# Patient Record
Sex: Female | Born: 1975 | Race: Asian | Hispanic: No | Marital: Married | Smoking: Never smoker
Health system: Southern US, Community
[De-identification: ages and names within clinical notes are randomized; demographics above are authoritative.]

## PROBLEM LIST (undated history)

## (undated) DIAGNOSIS — K2970 Gastritis, unspecified, without bleeding: Secondary | ICD-10-CM

## (undated) HISTORY — DX: Gastritis, unspecified, without bleeding: K29.70

---

## 2002-07-12 ENCOUNTER — Other Ambulatory Visit: Admission: RE | Admit: 2002-07-12 | Discharge: 2002-07-12 | Payer: Self-pay | Admitting: Obstetrics and Gynecology

## 2002-10-16 ENCOUNTER — Inpatient Hospital Stay (HOSPITAL_COMMUNITY): Admission: AD | Admit: 2002-10-16 | Discharge: 2002-10-17 | Payer: Self-pay | Admitting: Obstetrics and Gynecology

## 2006-11-22 ENCOUNTER — Inpatient Hospital Stay (HOSPITAL_COMMUNITY): Admission: AD | Admit: 2006-11-22 | Discharge: 2006-11-22 | Payer: Self-pay | Admitting: Obstetrics and Gynecology

## 2006-11-23 ENCOUNTER — Inpatient Hospital Stay (HOSPITAL_COMMUNITY): Admission: AD | Admit: 2006-11-23 | Discharge: 2006-11-23 | Payer: Self-pay | Admitting: Obstetrics and Gynecology

## 2007-02-22 ENCOUNTER — Inpatient Hospital Stay (HOSPITAL_COMMUNITY): Admission: AD | Admit: 2007-02-22 | Discharge: 2007-02-25 | Payer: Self-pay | Admitting: Obstetrics and Gynecology

## 2011-01-26 NOTE — H&P (Signed)
NAMELAAIBAH, WARTMAN                     ACCOUNT NO.:  [id number]   MEDICAL RECORD NO.:  [id number]          PATIENT TYPE:  INP   LOCATION:  9169                          FACILITY:  [facility]   PHYSICIAN:  Crist Fat. Rivard, M.D. DATE OF BIRTH:  1975/12/15   DATE OF ADMISSION:  02/22/2007  DATE OF DISCHARGE:                              HISTORY & PHYSICAL   Stacy Lyons is a 35 year old, married, Asian female, gravida 4, para 2-0-1-2  who presents at 40-1/7 weeks' gestation after having a routine office  visit and then going home.  During her office visit, it was found that  her cervix was dilated to an advanced state, and that combined with the  patient's history of rapid labor lead the offer of induction of labor to  occur.  She denies any bleeding, reports mild contractions, reports  positive fetal movement and no leaking.  Her pregnancy has been followed  by the [facility] OB/GYN MD service has been remarkable for:  1. Language barrier.  2. Late to care.  3. First trimester spotting.  4. Hepatitis B carrier.  5. History of ulcer.  6. First trimester tetracycline use.  7. Group B strep negative.   Her prenatal labs were collected on October 10, 2006:  Hemoglobin 11.5,  hematocrit 34, platelets 224,000.  Blood type O+, antibody negative, RPR  nonreactive, rubella immune, hepatitis B surface antigen negative, HIV  nonreactive.  Hemoglobin electrophoresis negative.  A 1-hour Glucola  from November 29, 2006 was 93.  Culture of the vaginal tract for group B  strep, gonorrhea and Chlamydia on Jan 19, 2007 were all negative.  Pap  was within normal limits, and gonorrhea and chlamydia were both  negative, and quad screen was normal.   HISTORY OF PRESENT PREGNANCY:  The patient presented for care at [facility] on October 10, 2006 at 20-5/7 weeks' gestation.  This was her  first prenatal visit.  Ultrasound on this day gives an [facility] of March 24, 2007.  All anatomy was seen.  Cervix was 1.7 cm  with dilation of 1.31 cm  with funneling on ultrasound.  To digital exam, cervix was fingertip and  50%.  Ultrasonography at 23-6/7 weeks' gestation showed her cervix to be  2.12 cm.  She was placed on bedrest, and ibuprofen routinely was  started.  Cervical length following week showed it to be 2.25 cm.  At 27  weeks' gestation, cervix was 1.83 cm with funneling.  The patient  received betamethasone series.  Ultrasound at 28 weeks showed cervix at  1.69 cm with digital exam fingertip and 50%.  The patient was continued  to be on bedrest and pelvic rest.  At 35 weeks, the patient's cervix was  3 cm, 80% vertex -1.  The rest of her prenatal care has been  unremarkable.   OBSTETRICAL HISTORY:  She is gravida 4, para 2-0-1-2.  In June of 2000,  she had a vaginal delivery of a female infant weighing 6 pounds 14  ounces at 40 weeks' gestation after 5-6 hours in labor.  She had no  anesthesia.  Infant's name was Stacy Lyons.  There were no complications,  and this birth took place in [place].  In August 2004, she had a  vaginal delivery of a female infant weighing 6 pounds 12 ounces at 40  weeks' gestation after 4 hours in labor with no anesthesia.  Infant's  name was Stacy Lyons, and there were no complications, and the patient was with  [facility] with that pregnancy and birth.   MEDICAL HISTORY:  She has no medication allergies.  She experienced  menarche at the age of 43 with 38-40 day cycles lasting 3-4 days.  The  patient is a hepatitis B carrier.  She has a history of an ulcer . Her  surgical history is negative.   FAMILY MEDICAL HISTORY:  The patient's daughter has asthma.  The  patient's father has type 2 diabetes.  Genetic history is negative.   SOCIAL HISTORY:  Patient is married to the father of the baby.  His name  is Qivia.  He is involved and supportive.  They both are high school  educated.  The patient is a Investment banker, operational.  Father of the baby is in  the restaurant  business.  They deny any alcohol, tobacco or illicit drug  use in the pregnancy, although the patient did take tetracycline in  August 2007 as a 2-week course.   OBJECTIVE:  Vital signs are stable.  She is afebrile.  HEENT:  Is grossly within normal limits.  CHEST:  Is clear to auscultation.  HEART:  Regular rate and rhythm.  ABDOMEN:  Is gravid in contour with fundal height extending  approximately 40 cm above the pubic symphysis.  Fetal heart rate is  reactive and reassuring.  Contractions are every 5-6 minutes and mild.  Cervix is 4 cm, 90% effaced, 0 to +1 per R.N. exam.  EXTREMITIES:  Are normal.   ASSESSMENT:  1. Intrauterine pregnancy at 40-1/7 weeks' gestation.  2. Advanced dilatation.  3. History of rapid labor.  4. Group B strep negative.   PLAN:  Dr. Estanislado Pandy reviewed the options with the patient that are  available to them.  Their preference is to rest tonight and proceed with  induction of labor in the morning.  They declined Pitocin and artificial  rupture of membranes tonight.      Cam Hai, C.N.M.      Crist Fat Rivard, M.D.  Electronically Signed    KS/MEDQ  D:  02/23/2007  T:  02/23/2007  Job:  [id number]

## 2011-01-29 NOTE — H&P (Signed)
Stacy Lyons, Stacy Lyons                                 ACCOUNT NO.:  [id number]   MEDICAL RECORD NO.:  [id number]                   PATIENT TYPE:  INP   LOCATION:  9170                                 [facility]ILITY:  [facility]   PHYSICIAN:  Osborn Coho, M.D.                DATE OF BIRTH:  [birth year]   DATE OF ADMISSION:  10/16/2002  DATE OF DISCHARGE:                                HISTORY & PHYSICAL   HISTORY OF PRESENT ILLNESS:  The patient is a 35 year old married Asian  female gravida 3, para 1-0-1-1 at 23 and 5/7 weeks who presented from the  office in early active labor.  She reports a little bit of vaginal spotting  over the weekend.  She denies any gushes of fluid.  She reports positive  fetal movement.  She reports uterine contractions approximately every five  minutes.  She was seen earlier over the weekend and was approximately 3 cm  at that time.  Her pregnancy has been followed at [facility] by  the certified nurse midwife service and has been complicated by late to  care, questionable LMP, slight language barrier.  Her group B Strep is  negative.   OB/GYN HISTORY:  She is a gravida 3, para 1-0-1-1 who delivered a viable  female infant in June of 2000 who weighed 6 pounds 14 ounces at [redacted] weeks  gestation following an eight hour labor.  She delivered with IV pain  medications and had no complications, though apparently did require forceps  for delivery.  She had a miscarriage in April of 2001 with no complications.   ALLERGIES:  She has no known drug allergies.   PAST MEDICAL HISTORY:  She reports having had the usual childhood diseases.  There are no other medical problems.  She has only been hospitalized for  childbirth.   FAMILY HISTORY:  Essentially negative, though her father does have diabetes  requiring oral medications.   GENETIC HISTORY:  Negative.   SOCIAL HISTORY:  She is married to [employer] and they are [place].  They  operate a Musician.   PRENATAL LABORATORIES:  Her blood type is O+.  Her antibody screen is  negative.  Sickle cell trait is negative.  Syphilis is nonreactive.  Rubella  is positive.  Hepatitis B surface antigen is negative.  HIV is nonreactive.  GC and Chlamydia are both negative.  Pap is within normal limits.  Her one  hour Glucola was 105.  Her 36-week beta Strep was negative as are gonorrhea  and Chlamydia.   PHYSICAL EXAMINATION:  VITAL SIGNS:  Stable.  She is afebrile.  HEENT:  Grossly within normal limits.  HEART:  Regular rhythm and rate.  CHEST:  Clear.  BREASTS:  Soft and nontender.  ABDOMEN:  Gravid with uterine contractions every five minutes.  Fetal heart  rate is reactive and reassuring.  PELVIC:  5 cm, 90%, vertex, -1 with intact membranes per Rica Koyanagi, C.N.M. at the office.  EXTREMITIES:  Within normal limits.   ASSESSMENT:  1. Intrauterine pregnancy at term.  2. Early active labor.  3. Negative group B Strep.   PLAN:  Admit to labor and delivery.  To follow routine C.N.M. orders.  To  notify Osborn Coho, M.D. of patient's admission.     Concha Pyo. Duplantis, C.N.M.              Osborn Coho, M.D.    SJD/MEDQ  D:  10/16/2002  T:  10/16/2002  Job:  [id number]

## 2011-07-01 LAB — CBC
HCT: 34.7 — ABNORMAL LOW
Hemoglobin: 12
Hemoglobin: 9.4 — ABNORMAL LOW
MCHC: 35.2
MCV: 100.1 — ABNORMAL HIGH
MCV: 100.2 — ABNORMAL HIGH
RBC: 2.67 — ABNORMAL LOW
RDW: 12.7

## 2012-06-05 ENCOUNTER — Telehealth: Payer: Self-pay | Admitting: Obstetrics and Gynecology

## 2012-06-05 NOTE — Telephone Encounter (Signed)
Chart reviewed. H&P from 2008 with info regarding pt's Hepatitis status faxed to [facility]. [phone]

## 2012-06-05 NOTE — Telephone Encounter (Signed)
TC from [facility], Communicable Disease. States has female pt with + Hepatitis B.  Needs to confirm status of his wife. Delivered 2008.  Informed will have to have record pulled form storage to confirm HBsag status.

## 2016-12-07 ENCOUNTER — Ambulatory Visit (INDEPENDENT_AMBULATORY_CARE_PROVIDER_SITE_OTHER): Payer: BLUE CROSS/BLUE SHIELD | Admitting: Family Medicine

## 2016-12-07 VITALS — BP 97/54 | HR 64 | Temp 99.0°F | Resp 17 | Ht 64.5 in | Wt 112.0 lb

## 2016-12-07 DIAGNOSIS — Z0000 Encounter for general adult medical examination without abnormal findings: Secondary | ICD-10-CM

## 2016-12-07 DIAGNOSIS — Z113 Encounter for screening for infections with a predominantly sexual mode of transmission: Secondary | ICD-10-CM

## 2016-12-07 DIAGNOSIS — Z1389 Encounter for screening for other disorder: Secondary | ICD-10-CM

## 2016-12-07 DIAGNOSIS — Z124 Encounter for screening for malignant neoplasm of cervix: Secondary | ICD-10-CM | POA: Diagnosis not present

## 2016-12-07 DIAGNOSIS — Z136 Encounter for screening for cardiovascular disorders: Secondary | ICD-10-CM | POA: Diagnosis not present

## 2016-12-07 DIAGNOSIS — Z130 Encounter for screening for diseases of the blood and blood-forming organs and certain disorders involving the immune mechanism: Secondary | ICD-10-CM

## 2016-12-07 DIAGNOSIS — Z23 Encounter for immunization: Secondary | ICD-10-CM | POA: Diagnosis not present

## 2016-12-07 DIAGNOSIS — Z1383 Encounter for screening for respiratory disorder NEC: Secondary | ICD-10-CM

## 2016-12-07 DIAGNOSIS — Z1329 Encounter for screening for other suspected endocrine disorder: Secondary | ICD-10-CM

## 2016-12-07 LAB — POCT URINALYSIS DIP (MANUAL ENTRY)
BILIRUBIN UA: NEGATIVE
GLUCOSE UA: NEGATIVE
Ketones, POC UA: NEGATIVE
LEUKOCYTES UA: NEGATIVE
NITRITE UA: NEGATIVE
PH UA: 7 (ref 5.0–8.0)
Protein Ur, POC: NEGATIVE
Spec Grav, UA: 1.015 (ref 1.030–1.035)
Urobilinogen, UA: 0.2 (ref ?–2.0)

## 2016-12-07 LAB — POCT WET + KOH PREP
TRICH BY WET PREP: ABSENT
YEAST BY WET PREP: ABSENT
Yeast by KOH: ABSENT

## 2016-12-07 NOTE — Progress Notes (Deleted)
   Subjective:  By signing my name below, I, Stann Oresung-Kai Tsai, attest that this documentation has been prepared under the direction and in the presence of Norberto SorensonEva Shaw, MD. Electronically Signed: Stann Oresung-Kai Tsai, Scribe. 12/07/2016 , 12:03 PM .  Patient was seen in Room 1 .   Patient ID: Stacy BowlJing Shepperson, female    DOB: Jul 12, 1976, 41 y.o.   MRN: [id number] Chief Complaint  Patient presents with  . Annual Exam   HPI Stacy Lyons is a 41 y.o. female who presents to Primary Care at [facility] for annual exam.   She also complains of right upper back pain, ongoing for a long time.    No past medical history on file. Prior to Admission medications   Not on File   Not on File   Review of Systems     Objective:   Physical Exam  Constitutional: She is oriented to person, place, and time. She appears well-developed and well-nourished. No distress.  HENT:  Head: Normocephalic and atraumatic.  Eyes: EOM are normal. Pupils are equal, round, and reactive to light.  Neck: Neck supple.  Cardiovascular: Normal rate.   Pulmonary/Chest: Effort normal. No respiratory distress.  Musculoskeletal: Normal range of motion.  Neurological: She is alert and oriented to person, place, and time.  Skin: Skin is warm and dry.  Psychiatric: She has a normal mood and affect. Her behavior is normal.  Nursing note and vitals reviewed.   BP (!) 97/54   Pulse 64   Temp 99 F (37.2 C) (Oral)   Resp 17   Ht 5' 4.5" (1.638 m)   Wt 112 lb (50.8 kg)   LMP 11/17/2016 (Approximate)   SpO2 97%   BMI 18.93 kg/m     Assessment & Plan:

## 2016-12-07 NOTE — Patient Instructions (Addendum)
Try some tums, zantac, or pepcid for when you have the epigastric pain.  If this is occurring more frequently or does not respond to the medication, than please come back in for further evaluation.  For your upper back pain, try heat, stretching, and massage. This is a common place for women to get muscle spasms and hold their tension or stress.  Use a tennis ball or a foam roller to lay on the area that you have having pain after 15 minutes of heat to try to get the muscle to relax. If this doesn't work, please come back in for further evaluation.    IF you received an x-ray today, you will receive an invoice from [facility]. Please contact [facility] at [phone] with questions or concerns regarding your invoice.   IF you received labwork today, you will receive an invoice from [facility]. Please contact [facility] at [phone] with questions or concerns regarding your invoice.   Our billing staff will not be able to assist you with questions regarding bills from these companies.  You will be contacted with the lab results as soon as they are available. The fastest way to get your results is to activate your My Chart account. Instructions are located on the last page of this paperwork. If you have not heard from [place] regarding the results in 2 weeks, please contact this office.    Health Maintenance, Female Adopting a healthy lifestyle and getting preventive care can go a long way to promote health and wellness. Talk with your health care provider about what schedule of regular examinations is right for you. This is a good chance for you to check in with your provider about disease prevention and staying healthy. In between checkups, there are plenty of things you can do on your own. Experts have done a lot of research about which lifestyle changes and preventive measures are most likely to keep you healthy. Ask your health care provider for more information. Weight and  diet Eat a healthy diet  Be sure to include plenty of vegetables, fruits, low-fat dairy products, and lean protein.  Do not eat a lot of foods high in solid fats, added sugars, or salt.  Get regular exercise. This is one of the most important things you can do for your health.  Most adults should exercise for at least 150 minutes each week. The exercise should increase your heart rate and make you sweat (moderate-intensity exercise).  Most adults should also do strengthening exercises at least twice a week. This is in addition to the moderate-intensity exercise. Maintain a healthy weight  Body mass index (BMI) is a measurement that can be used to identify possible weight problems. It estimates body fat based on height and weight. Your health care provider can help determine your BMI and help you achieve or maintain a healthy weight.  For females 33 years of age and older:  A BMI below 18.5 is considered underweight.  A BMI of 18.5 to 24.9 is normal.  A BMI of 25 to 29.9 is considered overweight.  A BMI of 30 and above is considered obese. Watch levels of cholesterol and blood lipids  You should start having your blood tested for lipids and cholesterol at 39 years of age, then have this test every 5 years.  You may need to have your cholesterol levels checked more often if:  Your lipid or cholesterol levels are high.  You are older than 39 years of age.  You are  at high risk for heart disease. Cancer screening Lung Cancer  Lung cancer screening is recommended for adults 65-33 years old who are at high risk for lung cancer because of a history of smoking.  A yearly low-dose CT scan of the lungs is recommended for people who:  Currently smoke.  Have quit within the past 15 years.  Have at least a 30-pack-year history of smoking. A pack year is smoking an average of one pack of cigarettes a day for 1 year.  Yearly screening should continue until it has been 15 years since  you quit.  Yearly screening should stop if you develop a health problem that would prevent you from having lung cancer treatment. Breast Cancer  Practice breast self-awareness. This means understanding how your breasts normally appear and feel.  It also means doing regular breast self-exams. Let your health care provider know about any changes, no matter how small.  If you are in your 20s or 30s, you should have a clinical breast exam (CBE) by a health care provider every 1-3 years as part of a regular health exam.  If you are 66 or older, have a CBE every year. Also consider having a breast X-ray (mammogram) every year.  If you have a family history of breast cancer, talk to your health care provider about genetic screening.  If you are at high risk for breast cancer, talk to your health care provider about having an MRI and a mammogram every year.  Breast cancer gene (BRCA) assessment is recommended for women who have family members with BRCA-related cancers. BRCA-related cancers include:  Breast.  Ovarian.  Tubal.  Peritoneal cancers.  Results of the assessment will determine the need for genetic counseling and BRCA1 and BRCA2 testing. Cervical Cancer  Your health care provider may recommend that you be screened regularly for cancer of the pelvic organs (ovaries, uterus, and vagina). This screening involves a pelvic examination, including checking for microscopic changes to the surface of your cervix (Pap test). You may be encouraged to have this screening done every 3 years, beginning at age 52.  For women ages 80-65, health care providers may recommend pelvic exams and Pap testing every 3 years, or they may recommend the Pap and pelvic exam, combined with testing for human papilloma virus (HPV), every 5 years. Some types of HPV increase your risk of cervical cancer. Testing for HPV may also be done on women of any age with unclear Pap test results.  Other health care providers  may not recommend any screening for nonpregnant women who are considered low risk for pelvic cancer and who do not have symptoms. Ask your health care provider if a screening pelvic exam is right for you.  If you have had past treatment for cervical cancer or a condition that could lead to cancer, you need Pap tests and screening for cancer for at least 20 years after your treatment. If Pap tests have been discontinued, your risk factors (such as having a new sexual partner) need to be reassessed to determine if screening should resume. Some women have medical problems that increase the chance of getting cervical cancer. In these cases, your health care provider may recommend more frequent screening and Pap tests. Colorectal Cancer  This type of cancer can be detected and often prevented.  Routine colorectal cancer screening usually begins at 39 years of age and continues through 39 years of age.  Your health care provider may recommend screening at an earlier age if  you have risk factors for colon cancer.  Your health care provider may also recommend using home test kits to check for hidden blood in the stool.  A small camera at the end of a tube can be used to examine your colon directly (sigmoidoscopy or colonoscopy). This is done to check for the earliest forms of colorectal cancer.  Routine screening usually begins at age 71.  Direct examination of the colon should be repeated every 5-10 years through 40 years of age. However, you may need to be screened more often if early forms of precancerous polyps or small growths are found. Skin Cancer  Check your skin from head to toe regularly.  Tell your health care provider about any new moles or changes in moles, especially if there is a change in a mole's shape or color.  Also tell your health care provider if you have a mole that is larger than the size of a pencil eraser.  Always use sunscreen. Apply sunscreen liberally and repeatedly  throughout the day.  Protect yourself by wearing long sleeves, pants, a wide-brimmed hat, and sunglasses whenever you are outside. Heart disease, diabetes, and high blood pressure  High blood pressure causes heart disease and increases the risk of stroke. High blood pressure is more likely to develop in:  People who have blood pressure in the high end of the normal range (130-139/85-89 mm Hg).  People who are overweight or obese.  People who are African American.  If you are 46-62 years of age, have your blood pressure checked every 3-5 years. If you are 52 years of age or older, have your blood pressure checked every year. You should have your blood pressure measured twice-once when you are at a hospital or clinic, and once when you are not at a hospital or clinic. Record the average of the two measurements. To check your blood pressure when you are not at a hospital or clinic, you can use:  An automated blood pressure machine at a pharmacy.  A home blood pressure monitor.  If you are between 63 years and 45 years old, ask your health care provider if you should take aspirin to prevent strokes.  Have regular diabetes screenings. This involves taking a blood sample to check your fasting blood sugar level.  If you are at a normal weight and have a low risk for diabetes, have this test once every three years after 39 years of age.  If you are overweight and have a high risk for diabetes, consider being tested at a younger age or more often. Preventing infection Hepatitis B  If you have a higher risk for hepatitis B, you should be screened for this virus. You are considered at high risk for hepatitis B if:  You were born in a country where hepatitis B is common. Ask your health care provider which countries are considered high risk.  Your parents were born in a high-risk country, and you have not been immunized against hepatitis B (hepatitis B vaccine).  You have HIV or AIDS.  You  use needles to inject street drugs.  You live with someone who has hepatitis B.  You have had sex with someone who has hepatitis B.  You get hemodialysis treatment.  You take certain medicines for conditions, including cancer, organ transplantation, and autoimmune conditions. Hepatitis C  Blood testing is recommended for:  Everyone born from [birth year] through 1963.  Anyone with known risk factors for hepatitis C. Sexually transmitted infections (STIs)  You should be screened for sexually transmitted infections (STIs) including gonorrhea and chlamydia if:  You are sexually active and are younger than 39 years of age.  You are older than 39 years of age and your health care provider tells you that you are at risk for this type of infection.  Your sexual activity has changed since you were last screened and you are at an increased risk for chlamydia or gonorrhea. Ask your health care provider if you are at risk.  If you do not have HIV, but are at risk, it may be recommended that you take a prescription medicine daily to prevent HIV infection. This is called pre-exposure prophylaxis (PrEP). You are considered at risk if:  You are sexually active and do not regularly use condoms or know the HIV status of your partner(s).  You take drugs by injection.  You are sexually active with a partner who has HIV. Talk with your health care provider about whether you are at high risk of being infected with HIV. If you choose to begin PrEP, you should first be tested for HIV. You should then be tested every 3 months for as long as you are taking PrEP. Pregnancy  If you are premenopausal and you may become pregnant, ask your health care provider about preconception counseling.  If you may become pregnant, take 400 to 800 micrograms (mcg) of folic acid every day.  If you want to prevent pregnancy, talk to your health care provider about birth control (contraception). Osteoporosis and  menopause  Osteoporosis is a disease in which the bones lose minerals and strength with aging. This can result in serious bone fractures. Your risk for osteoporosis can be identified using a bone density scan.  If you are 23 years of age or older, or if you are at risk for osteoporosis and fractures, ask your health care provider if you should be screened.  Ask your health care provider whether you should take a calcium or vitamin D supplement to lower your risk for osteoporosis.  Menopause may have certain physical symptoms and risks.  Hormone replacement therapy may reduce some of these symptoms and risks. Talk to your health care provider about whether hormone replacement therapy is right for you. Follow these instructions at home:  Schedule regular health, dental, and eye exams.  Stay current with your immunizations.  Do not use any tobacco products including cigarettes, chewing tobacco, or electronic cigarettes.  If you are pregnant, do not drink alcohol.  If you are breastfeeding, limit how much and how often you drink alcohol.  Limit alcohol intake to no more than 1 drink per day for nonpregnant women. One drink equals 12 ounces of beer, 5 ounces of wine, or 1 ounces of hard liquor.  Do not use street drugs.  Do not share needles.  Ask your health care provider for help if you need support or information about quitting drugs.  Tell your health care provider if you often feel depressed.  Tell your health care provider if you have ever been abused or do not feel safe at home. This information is not intended to replace advice given to you by your health care provider. Make sure you discuss any questions you have with your health care provider. Document Released: 03/15/2011 Document Revised: 02/05/2016 Document Reviewed: 06/03/2015 Elsevier Interactive Patient Education  2017 [employer].

## 2016-12-07 NOTE — Progress Notes (Signed)
Subjective:    Patient ID: Stacy Lyons, female    DOB: 1976-04-07, 41 y.o.   MRN: [id number] Chief Complaint  Patient presents with  . Annual Exam    HPI  Pt is here for her physical.  She states she has not been to the doctor in over 5 years.  Her youngest child is 519 yo and it might even have been about then that she last had her pelvic exam.  No h/o abnml pelvics.  Is married in a monogamous relationship. Use condoms for birth control. She does not desire any additional children but desires any form of birth control (ocps,im, implant, iud, ring, patch, etc).  She is fasting today.  Does not get any formal exercise as works all the time. No specific diet. No vit or otc meds/supps.  No family hx.  Depression screen PHQ 2/9 12/07/2016  Decreased Interest 0  Down, Depressed, Hopeless 0  PHQ - 2 Score 0    No past medical history on file. No past surgical history on file. No current outpatient prescriptions on file prior to visit.   No current facility-administered medications on file prior to visit.    Not on File No family history on file. Social History   Social History  . Marital status: Married    Spouse name: N/A  . Number of children: N/A  . Years of education: N/A   Social History Main Topics  . Smoking status: Never Smoker  . Smokeless tobacco: Never Used  . Alcohol use No  . Drug use: No  . Sexual activity: No   Other Topics Concern  . None   Social History Narrative  . None     Review of Systems  Gastrointestinal: Positive for abdominal pain.  Musculoskeletal: Positive for back pain.  All other systems reviewed and are negative. c/o chronic right upper back pain c/o occ epigastric pain when she is hungry - advised to try tums.    Objective:   Physical Exam  Constitutional: She is oriented to person, place, and time. She appears well-developed and well-nourished. No distress.  HENT:  Head: Normocephalic and atraumatic.  Right Ear: Tympanic  membrane, external ear and ear canal normal.  Left Ear: Tympanic membrane, external ear and ear canal normal.  Nose: Mucosal edema present. No rhinorrhea.  Mouth/Throat: Uvula is midline, oropharynx is clear and moist and mucous membranes are normal. No posterior oropharyngeal erythema.  Eyes: Conjunctivae and EOM are normal. Pupils are equal, round, and reactive to light. Right eye exhibits no discharge. Left eye exhibits no discharge. No scleral icterus.  Neck: Normal range of motion. Neck supple. No thyromegaly present.  Cardiovascular: Normal rate, regular rhythm, normal heart sounds and intact distal pulses.   Pulmonary/Chest: Effort normal and breath sounds normal. No respiratory distress.  Abdominal: Soft. Bowel sounds are normal. There is no tenderness.  Genitourinary: Vagina normal and uterus normal. No breast swelling, tenderness, discharge or bleeding. Cervix exhibits no motion tenderness and no friability. Right adnexum displays no mass and no tenderness. Left adnexum displays no mass and no tenderness.  Musculoskeletal: She exhibits no edema.  Lymphadenopathy:    She has no cervical adenopathy.  Neurological: She is alert and oriented to person, place, and time. She has normal reflexes.  Skin: Skin is warm and dry. She is not diaphoretic. No erythema.  Psychiatric: She has a normal mood and affect. Her behavior is normal.      BP (!) 97/54   Pulse 64  Temp 99 F (37.2 C) (Oral)   Resp 17   Ht 5' 4.5" (1.638 m)   Wt 112 lb (50.8 kg)   LMP 11/17/2016 (Approximate)   SpO2 97%   BMI 18.93 kg/m      Assessment & Plan:   1. Annual physical exam   2. Routine screening for STI (sexually transmitted infection)   3. Screening for cardiovascular, respiratory, and genitourinary diseases   4. Screening for cervical cancer   5. Screening for deficiency anemia   6. Screening for thyroid disorder     Orders Placed This Encounter  Procedures  . Tdap vaccine greater than or  equal to 7yo IM  . Comprehensive metabolic panel    Order Specific Question:   Has the patient fasted?    Answer:   Yes  . CBC  . TSH  . Lipid panel    Order Specific Question:   Has the patient fasted?    Answer:   Yes  . HCV Ab w/Rflx to Verification  . HIV antibody  . RPR  . Care order/instruction:    Scheduling Instructions:     Complete orders, AVS and go.  Marland Kitchen POCT urinalysis dipstick  . POCT Wet + KOH Prep    Norberto Sorenson, M.D.  Primary Care at [facility] [address] [place], [place] [address] [phone] phone [phone] fax  12/08/16 4:14 AM

## 2016-12-08 LAB — COMPREHENSIVE METABOLIC PANEL
ALK PHOS: 61 IU/L (ref 39–117)
ALT: 24 IU/L (ref 0–32)
AST: 22 IU/L (ref 0–40)
Albumin/Globulin Ratio: 1.7 (ref 1.2–2.2)
Albumin: 4.7 g/dL (ref 3.5–5.5)
BUN / CREAT RATIO: 21 (ref 9–23)
BUN: 13 mg/dL (ref 6–20)
Bilirubin Total: 0.7 mg/dL (ref 0.0–1.2)
CHLORIDE: 102 mmol/L (ref 96–106)
CO2: 24 mmol/L (ref 18–29)
CREATININE: 0.62 mg/dL (ref 0.57–1.00)
Calcium: 9.3 mg/dL (ref 8.7–10.2)
GFR calc Af Amer: 132 mL/min/{1.73_m2} (ref >59)
GFR calc non Af Amer: 115 mL/min/{1.73_m2} (ref >59)
GLUCOSE: 84 mg/dL (ref 65–99)
Globulin, Total: 2.7 g/dL (ref 1.5–4.5)
Potassium: 4 mmol/L (ref 3.5–5.2)
Sodium: 143 mmol/L (ref 134–144)
Total Protein: 7.4 g/dL (ref 6.0–8.5)

## 2016-12-08 LAB — LIPID PANEL
CHOL/HDL RATIO: 2.7 ratio (ref 0.0–4.4)
CHOLESTEROL TOTAL: 172 mg/dL (ref 100–199)
HDL: 64 mg/dL (ref >39)
LDL CALC: 96 mg/dL (ref 0–99)
TRIGLYCERIDES: 61 mg/dL (ref 0–149)
VLDL Cholesterol Cal: 12 mg/dL (ref 5–40)

## 2016-12-08 LAB — CBC
HEMATOCRIT: 38 % (ref 34.0–46.6)
Hemoglobin: 12.6 g/dL (ref 11.1–15.9)
MCH: 32 pg (ref 26.6–33.0)
MCHC: 33.2 g/dL (ref 31.5–35.7)
MCV: 96 fL (ref 79–97)
PLATELETS: 242 10*3/uL (ref 150–379)
RBC: 3.94 x10E6/uL (ref 3.77–5.28)
RDW: 12.2 % — AB (ref 12.3–15.4)
WBC: 4.4 10*3/uL (ref 3.4–10.8)

## 2016-12-08 LAB — TSH: TSH: 2.19 u[IU]/mL (ref 0.450–4.500)

## 2016-12-09 LAB — PAP IG, CT-NG NAA, HPV HIGH-RISK
Chlamydia, Nuc. Acid Amp: NEGATIVE
GONOCOCCUS BY NUCLEIC ACID AMP: NEGATIVE
HPV, HIGH-RISK: NEGATIVE
PAP Smear Comment: 0

## 2016-12-10 LAB — HIV ANTIBODY (ROUTINE TESTING W REFLEX)

## 2016-12-10 LAB — RPR: RPR: NONREACTIVE

## 2016-12-10 LAB — HCV AB W/RFLX TO VERIFICATION: HCV Ab: <0.1 s/co ratio (ref 0.0–0.9)

## 2016-12-10 LAB — HCV INTERPRETATION

## 2017-10-14 ENCOUNTER — Encounter: Payer: Self-pay | Admitting: Family Medicine

## 2017-10-14 ENCOUNTER — Ambulatory Visit (INDEPENDENT_AMBULATORY_CARE_PROVIDER_SITE_OTHER): Payer: BLUE CROSS/BLUE SHIELD

## 2017-10-14 ENCOUNTER — Other Ambulatory Visit: Payer: Self-pay

## 2017-10-14 ENCOUNTER — Ambulatory Visit: Payer: BLUE CROSS/BLUE SHIELD | Admitting: Family Medicine

## 2017-10-14 VITALS — BP 96/60 | HR 73 | Temp 98.6°F | Resp 16 | Ht 63.5 in | Wt 111.4 lb

## 2017-10-14 DIAGNOSIS — D7589 Other specified diseases of blood and blood-forming organs: Secondary | ICD-10-CM

## 2017-10-14 DIAGNOSIS — R079 Chest pain, unspecified: Secondary | ICD-10-CM | POA: Diagnosis not present

## 2017-10-14 DIAGNOSIS — N644 Mastodynia: Secondary | ICD-10-CM

## 2017-10-14 DIAGNOSIS — R222 Localized swelling, mass and lump, trunk: Secondary | ICD-10-CM

## 2017-10-14 MED ORDER — OMEPRAZOLE 40 MG PO CPDR: 40.0000 mg | DELAYED_RELEASE_CAPSULE | Freq: Every day | ORAL | 3 refills | Status: AC

## 2017-10-14 NOTE — Patient Instructions (Addendum)
IF you received an x-ray today, you will receive an invoice from [facility]. Please contact [facility] at [phone] with questions or concerns regarding your invoice.   IF you received labwork today, you will receive an invoice from [facility]. Please contact [facility] at [phone] with questions or concerns regarding your invoice.   Our billing staff will not be able to assist you with questions regarding bills from these companies.  You will be contacted with the lab results as soon as they are available. The fastest way to get your results is to activate your My Chart account. Instructions are located on the last page of this paperwork. If you have not heard from [place] regarding the results in 2 weeks, please contact this office.     Chest Wall Pain Chest wall pain is pain in or around the bones and muscles of your chest. Sometimes, an injury causes this pain. Sometimes, the cause may not be known. This pain may take several weeks or longer to get better. Follow these instructions at home: Pay attention to any changes in your symptoms. Take these actions to help with your pain:  Rest as told by your health care provider.  Avoid activities that cause pain. These include any activities that use your chest muscles or your abdominal and side muscles to lift heavy items.  If directed, apply ice to the painful area: ? Put ice in a plastic bag. ? Place a towel between your skin and the bag. ? Leave the ice on for 20 minutes, 2-3 times per day.  Take over-the-counter and prescription medicines only as told by your health care provider.  Do not use tobacco products, including cigarettes, chewing tobacco, and e-cigarettes. If you need help quitting, ask your health care provider.  Keep all follow-up visits as told by your health care provider. This is important.  Contact a health care provider if:  You have a fever.  Your chest pain becomes worse.  You have  new symptoms. Get help right away if:  You have nausea or vomiting.  You feel sweaty or light-headed.  You have a cough with phlegm (sputum) or you cough up blood.  You develop shortness of breath. This information is not intended to replace advice given to you by your health care provider. Make sure you discuss any questions you have with your health care provider. Document Released: 08/30/2005 Document Revised: 01/08/2016 Document Reviewed: 11/25/2014 Elsevier Interactive Patient Education  2018 [employer]vier Inc.  Nonspecific Chest Pain Chest pain can be caused by many different conditions. There is always a chance that your pain could be related to something serious, such as a heart attack or a blood clot in your lungs. Chest pain can also be caused by conditions that are not life-threatening. If you have chest pain, it is very important to follow up with your health care provider. What are the causes? Causes of this condition include:  Heartburn.  Pneumonia or bronchitis.  Anxiety or stress.  Inflammation around your heart (pericarditis) or lung (pleuritis or pleurisy).  A blood clot in your lung.  A collapsed lung (pneumothorax). This can develop suddenly on its own (spontaneous pneumothorax) or from trauma to the chest.  Shingles infection (varicella-zoster virus).  Heart attack.  Damage to the bones, muscles, and cartilage that make up your chest wall. This can include: ? Bruised bones due to injury. ? Strained muscles or cartilage due to frequent or repeated coughing or overwork. ? Fracture to one  or more ribs. ? Sore cartilage due to inflammation (costochondritis).  What increases the risk? Risk factors for this condition may include:  Activities that increase your risk for trauma or injury to your chest.  Respiratory infections or conditions that cause frequent coughing.  Medical conditions or overeating that can cause heartburn.  Heart disease or family  history of heart disease.  Conditions or health behaviors that increase your risk of developing a blood clot.  Having had chicken pox (varicella zoster).  What are the signs or symptoms? Chest pain can feel like:  Burning or tingling on the surface of your chest or deep in your chest.  Crushing, pressure, aching, or squeezing pain.  Dull or sharp pain that is worse when you move, cough, or take a deep breath.  Pain that is also felt in your back, neck, shoulder, or arm, or pain that spreads to any of these areas.  Your chest pain may come and go, or it may stay constant. How is this diagnosed? Lab tests or other studies may be needed to find the cause of your pain. Your health care provider may have you take a test called an ECG (electrocardiogram). An ECG records your heartbeat patterns at the time the test is performed. You may also have other tests, such as:  Transthoracic echocardiogram (TTE). In this test, sound waves are used to create a picture of the heart structures and to look at how blood flows through your heart.  Transesophageal echocardiogram (TEE).This is a more advanced imaging test that takes images from inside your body. It allows your health care provider to see your heart in finer detail.  Cardiac monitoring. This allows your health care provider to monitor your heart rate and rhythm in real time.  Holter monitor. This is a portable device that records your heartbeat and can help to diagnose abnormal heartbeats. It allows your health care provider to track your heart activity for several days, if needed.  Stress tests. These can be done through exercise or by taking medicine that makes your heart beat more quickly.  Blood tests.  Other imaging tests.  How is this treated? Treatment depends on what is causing your chest pain. Treatment may include:  Medicines. These may include: ? Acid blockers for heartburn. ? Anti-inflammatory medicine. ? Pain medicine  for inflammatory conditions. ? Antibiotic medicine, if an infection is present. ? Medicines to dissolve blood clots. ? Medicines to treat coronary artery disease (CAD).  Supportive care for conditions that do not require medicines. This may include: ? Resting. ? Applying heat or cold packs to injured areas. ? Limiting activities until pain decreases.  Follow these instructions at home: Medicines  If you were prescribed an antibiotic, take it as told by your health care provider. Do not stop taking the antibiotic even if you start to feel better.  Take over-the-counter and prescription medicines only as told by your health care provider. Lifestyle  Do not use any products that contain nicotine or tobacco, such as cigarettes and e-cigarettes. If you need help quitting, ask your health care provider.  Do not drink alcohol.  Make lifestyle changes as directed by your health care provider. These may include: ? Getting regular exercise. Ask your health care provider to suggest some activities that are safe for you. ? Eating a heart-healthy diet. A registered dietitian can help you to learn healthy eating options. ? Maintaining a healthy weight. ? Managing diabetes, if necessary. ? Reducing stress, such as with yoga  or relaxation techniques. General instructions  Avoid any activities that bring on chest pain.  If heartburn is the cause for your chest pain, raise (elevate) the head of your bed about 6 inches (15 cm) by putting blocks under the legs. Sleeping with more pillows does not effectively relieve heartburn because it only changes the position of your head.  Keep all follow-up visits as told by your health care provider. This is important. This includes any further testing if your chest pain does not go away. Contact a health care provider if:  Your chest pain does not go away.  You have a rash with blisters on your chest.  You have a fever.  You have chills. Get help right  away if:  Your chest pain is worse.  You have a cough that gets worse, or you cough up blood.  You have severe pain in your abdomen.  You have severe weakness.  You faint.  You have sudden, unexplained chest discomfort.  You have sudden, unexplained discomfort in your arms, back, neck, or jaw.  You have shortness of breath at any time.  You suddenly start to sweat, or your skin gets clammy.  You feel nauseous or you vomit.  You suddenly feel light-headed or dizzy.  Your heart begins to beat quickly, or it feels like it is skipping beats. These symptoms may represent a serious problem that is an emergency. Do not wait to see if the symptoms will go away. Get medical help right away. Call your local emergency services (911 in the [place] Do not drive yourself to the hospital. This information is not intended to replace advice given to you by your health care provider. Make sure you discuss any questions you have with your health care provider. Document Released: 06/09/2005 Document Revised: 05/24/2016 Document Reviewed: 05/24/2016 Elsevier Interactive Patient Education  2017 [employer]

## 2017-10-14 NOTE — Progress Notes (Signed)
Subjective:    Patient ID: Stacy Lyons, female    DOB: Jan 25, 1976, 42 y.o.   MRN: [id number] Chief Complaint  Patient presents with  . chest pain    x 1 month, "feels like a needle pinch" left side rib around to the back    HPI  Stacy Lyons is a delightful 42 yo woman who is accompanied by her husband today with continuing c/o stinging left upper chest pain, a different chest heaviness, and a new breast/chest wall nodule. Her husband helps her translate and they remember discussing his initial chest stinging with me at pt's CPE 10 mos ago but everything on her CPE was normal and I advised they come in for a visit dedicated to eval of her sxs if they persist which is what she is here for today.  There was a significant amount of confusion in differentiating each of these 3 different concerns and getting independent time course and hx of each partially due to language barrier and also as pt states she hasn't paid enough attention to her sxs to answer many of my questions.  Chest pain has been there for about a month - feels like a need poking her.  I saw her 1 year ago.  For the past month, she has had intermittent daily stabbing pain.  She works in [employer].  She doesn't pay attention to what makes it worse or better.  No SHoB,  No change in exercise tolerance.  No heartburn or indigestion. She does also feel  Pinching in the upper abdomen. Long-standing stomach problems prior from not eating regularly. No n/v.  No cough, occ palpitations at night when she is trying to go to sleep.  Sometimes a cough wakes her up in the middle of the night. She will go for 2-3 days in between bowel movements.  Also with left-sided breast pain for over a year that radiates into her axilla and into her left upper back as well.   Working in [employer].   No vit/supp/otc meds.  No past medical history on file. History reviewed. No pertinent surgical history. No current outpatient medications on file prior to visit.    No current facility-administered medications on file prior to visit.    No Known Allergies No family history on file. Social History   Socioeconomic History  . Marital status: Married    Spouse name: None  . Number of children: None  . Years of education: None  . Highest education level: None  Social Needs  . Financial resource strain: None  . Food insecurity - worry: None  . Food insecurity - inability: None  . Transportation needs - medical: None  . Transportation needs - non-medical: None  Occupational History  . None  Tobacco Use  . Smoking status: Never Smoker  . Smokeless tobacco: Never Used  Substance and Sexual Activity  . Alcohol use: No  . Drug use: No  . Sexual activity: No  Other Topics Concern  . None  Social History Narrative  . None   [facility] 10/14/2017 12/07/2016  Decreased Interest 0 0  Down, Depressed, Hopeless 0 0  PHQ - 2 Score 0 0     Review of Systems  Constitutional: Negative for activity change, appetite change, chills, diaphoresis, fever and unexpected weight change.  Eyes: Negative for visual disturbance.  Respiratory: Positive for cough and chest tightness. Negative for shortness of breath and wheezing.   Cardiovascular: Positive for chest pain and palpitations. Negative  for leg swelling.  Gastrointestinal: Positive for abdominal pain and constipation. Negative for nausea and vomiting.  Genitourinary: Negative for decreased urine volume.  Musculoskeletal: Positive for back pain. Negative for arthralgias, gait problem, joint swelling and myalgias.  Skin: Negative for color change, rash and wound.  Neurological: Negative for syncope, weakness and headaches.  Hematological: Does not bruise/bleed easily.  Psychiatric/Behavioral: Negative for dysphoric mood.       Objective:   Physical Exam  Constitutional: She is oriented to person, place, and time. She appears well-developed and well-nourished. No distress.  HENT:   Head: Normocephalic and atraumatic.  Right Ear: External ear normal.  Left Ear: External ear normal.  Eyes: Conjunctivae are normal. No scleral icterus.  Neck: Normal range of motion. Neck supple. No thyromegaly present.  Cardiovascular: Normal rate, regular rhythm, normal heart sounds and intact distal pulses.  Pulmonary/Chest: Effort normal and breath sounds normal. No respiratory distress. She exhibits mass. She exhibits no tenderness, no bony tenderness, no crepitus, no edema, no deformity and no swelling. Right breast exhibits no inverted nipple, no nipple discharge, no skin change and no tenderness. Left breast exhibits no inverted nipple, no nipple discharge, no skin change and no tenderness. Breasts are symmetrical.    Tiny 1x70mm subdermal poorly defined mobile soft mass just distal and lateal to left breast on top of rib  Genitourinary: No breast swelling, tenderness, discharge or bleeding.  Musculoskeletal: She exhibits no edema.  Lymphadenopathy:    She has no cervical adenopathy.    She has no axillary adenopathy.       Right: No supraclavicular adenopathy present.       Left: No supraclavicular adenopathy present.  Neurological: She is alert and oriented to person, place, and time.  Skin: Skin is warm and dry. She is not diaphoretic. No erythema.  Psychiatric: She has a normal mood and affect. Her behavior is normal.         BP 96/60   Pulse 73   Temp 98.6 F (37 C) (Oral)   Resp 16   Ht 5' 3.5" (1.613 m)   Wt 111 lb 6.4 oz (50.5 kg)   LMP 09/23/2017   SpO2 100%   BMI 19.42 kg/m  Predicted peak flow 479 L/min - pt's peak flow 350 but CMA noted what seemed like poor effort - possibly due to language barrier  Dg Chest 2 View  Result Date: 10/14/2017 CLINICAL DATA:  atypical bilateral upper stabbing CP and left lower chest pain radiating to axilla and scapula EXAM: CHEST  2 VIEW COMPARISON:  None. FINDINGS: The heart size and mediastinal contours are within normal  limits. Both lungs are clear. No pleural effusion or pneumothorax. The visualized skeletal structures are unremarkable. IMPRESSION: No active cardiopulmonary disease. Electronically Signed   By: Amie Portland M.D.   On: 10/14/2017 10:33   EKG: NSR, no acute ischemic changes noted. No prior EKG available for comparison.   I have personally reviewed the EKG tracing and agree with the computer interpretation.  Assessment & Plan:   1. Left sided chest pain - several different types of very atypical long-standing pain. Normal CPE and full labs 10 mos prior and pt will RTC in 2 mos again to f/u CPE.   I wonder if this could poss be due to anxiety as when pt and I were reviewing the normal tests from her CPE and today at the end of the visit she expressed a great deal of relief.  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  Will try empiric 6-8 wk course of ppi and then recheck effects at CPE in 2 mos.  2. Breast pain, left   3. Nodule of left anterior chest wall - small subcutaneous mass over rib just distal and lateal to breast - this is certainly within the region that women can have breast tissue extend so I think we should proceed with diagnostic mam and [place] for further eval. Pt agreeable.    Orders Placed This Encounter  Procedures  . DG Chest 2 View    Standing Status:   Future    Number of Occurrences:   1    Standing Expiration Date:   10/14/2018    Order Specific Question:   Reason for Exam (SYMPTOM  OR DIAGNOSIS REQUIRED)    Answer:   atypical bilateral upper stabbing CP and left lower chest pain radiating to axilla and scapula    Order Specific Question:   Is the patient pregnant?    Answer:   No    Order Specific Question:   Preferred imaging location?    Answer:   External  . [facility]    Standing Status:   Future    Standing Expiration Date:   12/13/2018    Order Specific Question:   Reason for Exam (SYMPTOM  OR DIAGNOSIS REQUIRED)    Answer:   small  sev mm mobile nodule on distal and lateral to left breast, left breast and axilla pain    Order Specific Question:   Is the patient pregnant?    Answer:   No    Order Specific Question:   Preferred imaging location?    Answer:   [facility]  . US BREAST COMPLETE UNI LEFT INC AXILLA    Standing Status:   Future    Standing Expiration Date:   12/13/2018    Order Specific Question:   Reason for Exam (SYMPTOM  OR DIAGNOSIS REQUIRED)    Answer:   small sev mm mobile nodule on distal and lateral to left breast, left breast and axilla pain    Order Specific Question:   Preferred imaging location?    Answer:   [facility]  . H. pylori breath test  . CBC with Differential/Platelet  . Sedimentation Rate  . C-reactive protein  . Care order/instruction:    Scheduling Instructions:     Peak Flow (IF NEB IS ORDERED PLEASE DO BEFORE AND AFTER NEB)  . EKG 12-Lead    Meds ordered this encounter  Medications  . omeprazole (PRILOSEC) 40 MG capsule    Sig: Take 1 capsule (40 mg total) by mouth daily. 30 minutes before breakast    Dispense:  30 capsule    Refill:  3   Over 40 min spent in face-to-face evaluation of and consultation with patient and coordination of care.  Over 50% of this time was spent counseling this patient regarding potential etiologies of atypical chest pain and reassurance that sxs are not concerning for any harmful or progressive pathology.   Norberto Sorenson, M.D.  Primary Care at [facility] [address] [place], [place] [address] [phone] phone [phone] fax  10/14/17 11:25 AM

## 2017-10-15 LAB — CBC WITH DIFFERENTIAL/PLATELET
BASOS: 1 %
Basophils Absolute: 0 10*3/uL (ref 0.0–0.2)
EOS (ABSOLUTE): 0.1 10*3/uL (ref 0.0–0.4)
EOS: 2 %
HEMATOCRIT: 37.7 % (ref 34.0–46.6)
HEMOGLOBIN: 12.1 g/dL (ref 11.1–15.9)
IMMATURE GRANS (ABS): 0 10*3/uL (ref 0.0–0.1)
IMMATURE GRANULOCYTES: 0 %
LYMPHS: 35 %
Lymphocytes Absolute: 1.4 10*3/uL (ref 0.7–3.1)
MCH: 31.5 pg (ref 26.6–33.0)
MCHC: 32.1 g/dL (ref 31.5–35.7)
MCV: 98 fL — AB (ref 79–97)
Monocytes Absolute: 0.3 10*3/uL (ref 0.1–0.9)
Monocytes: 7 %
NEUTROS ABS: 2.3 10*3/uL (ref 1.4–7.0)
NEUTROS PCT: 55 %
Platelets: 246 10*3/uL (ref 150–379)
RBC: 3.84 x10E6/uL (ref 3.77–5.28)
RDW: 12.8 % (ref 12.3–15.4)
WBC: 4.1 10*3/uL (ref 3.4–10.8)

## 2017-10-15 LAB — C-REACTIVE PROTEIN: CRP: <0.3 mg/L (ref 0.0–4.9)

## 2017-10-15 LAB — SEDIMENTATION RATE: Sed Rate: 2 mm/hr (ref 0–32)

## 2017-10-16 ENCOUNTER — Encounter: Payer: Self-pay | Admitting: Family Medicine

## 2017-10-16 LAB — H. PYLORI BREATH TEST: H PYLORI BREATH TEST: NEGATIVE

## 2017-10-17 ENCOUNTER — Encounter: Payer: Self-pay | Admitting: *Deleted

## 2017-10-18 NOTE — Addendum Note (Signed)
Addended by: Baldwin CrownJOHNSON, Leeanne Butters D on: 10/18/2017 09:22 AM   Modules accepted: Orders

## 2017-10-19 LAB — B12 AND FOLATE PANEL
Folate: 17.3 ng/mL (ref >3.0)
VITAMIN B 12: 612 pg/mL (ref 232–1245)

## 2017-11-14 ENCOUNTER — Ambulatory Visit
Admission: RE | Admit: 2017-11-14 | Discharge: 2017-11-14 | Disposition: A | Discharge status: Home / Self Care | Payer: BLUE CROSS/BLUE SHIELD | Source: Ambulatory Visit | Attending: Family Medicine | Admitting: Family Medicine

## 2017-11-14 ENCOUNTER — Other Ambulatory Visit: Payer: Self-pay

## 2017-11-14 DIAGNOSIS — N644 Mastodynia: Secondary | ICD-10-CM

## 2017-11-14 DIAGNOSIS — R079 Chest pain, unspecified: Secondary | ICD-10-CM

## 2017-11-14 DIAGNOSIS — R222 Localized swelling, mass and lump, trunk: Secondary | ICD-10-CM

## 2019-07-02 IMAGING — MG DIGITAL DIAGNOSTIC BILATERAL MAMMOGRAM WITH TOMO AND CAD
8 series · 9 of 24 positions shown · non-contrast
Comparison: Previous exam(s).

CLINICAL DATA: The patient had a lump in the inferior left breast
which she no longer feels. She is still concerned.

EXAM:
DIGITAL DIAGNOSTIC BILATERAL MAMMOGRAM WITH CAD AND TOMO
ULTRASOUND LEFT BREAST

[L MLO synth-2D]
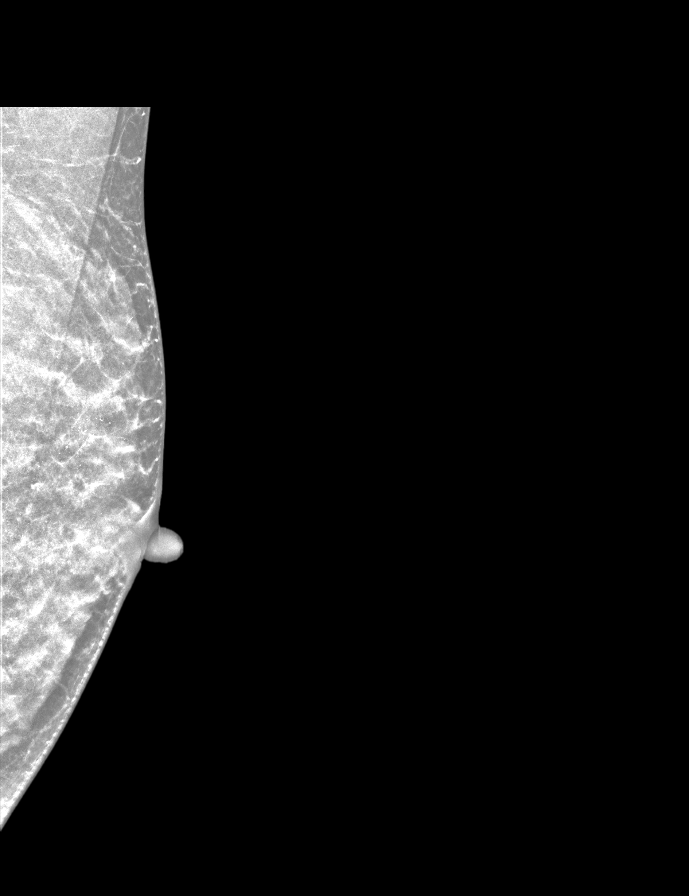

[R MLO synth-2D]
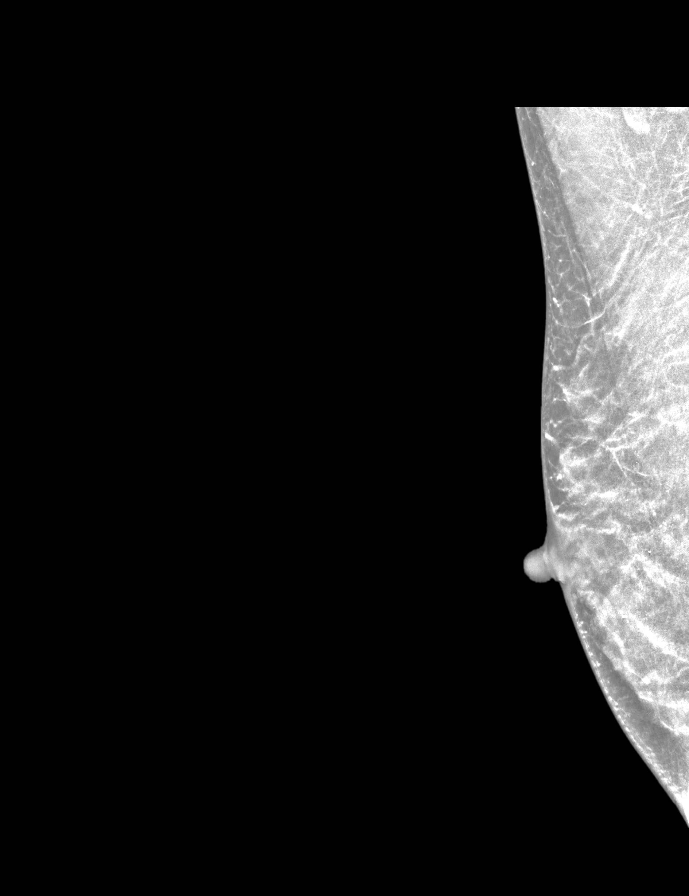

[L CC synth-2D]
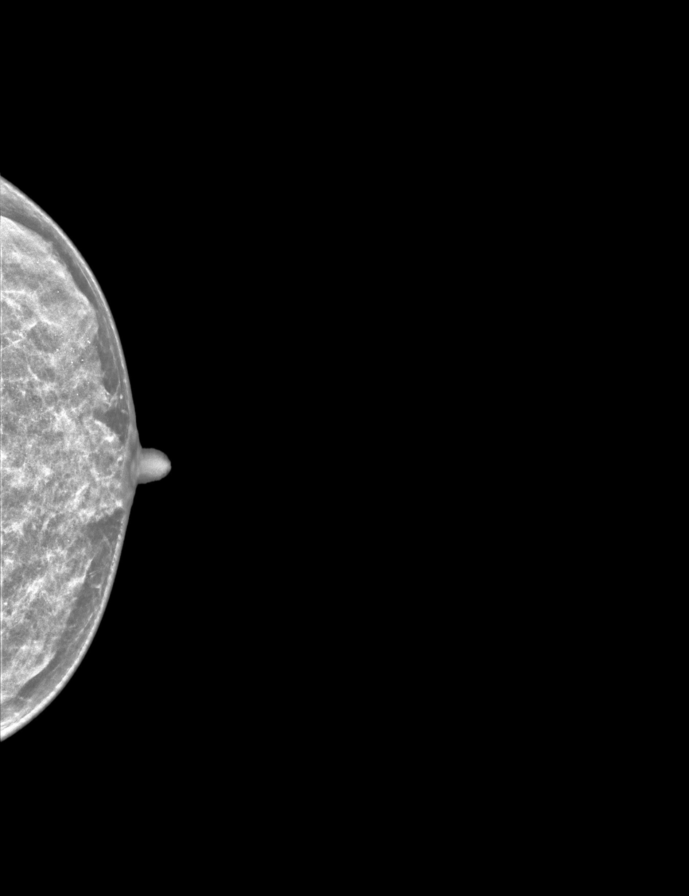

[R CC synth-2D]
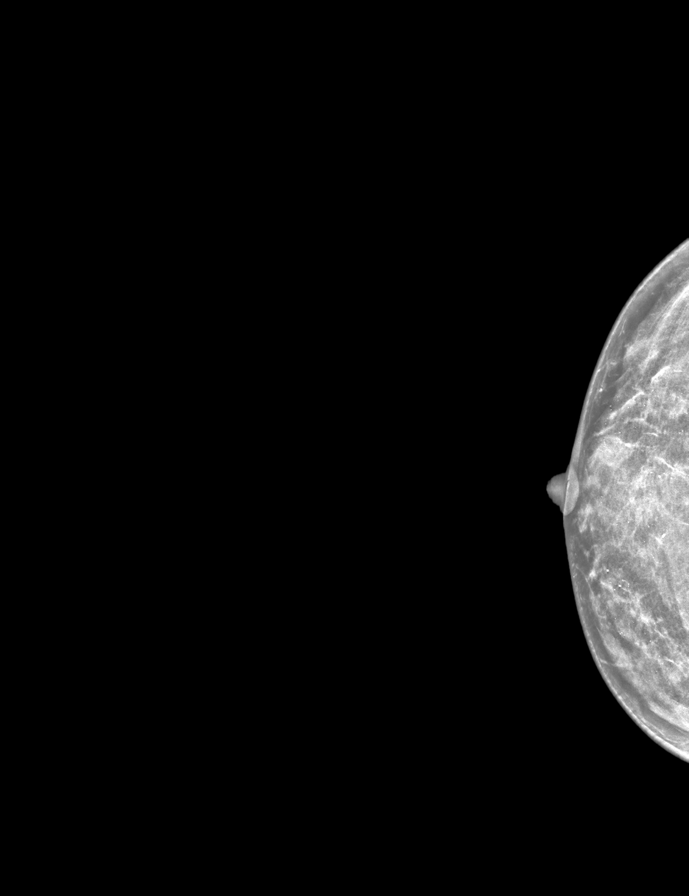

[R CC tomo · 2 of 42 frames shown]
[frame 14/42]
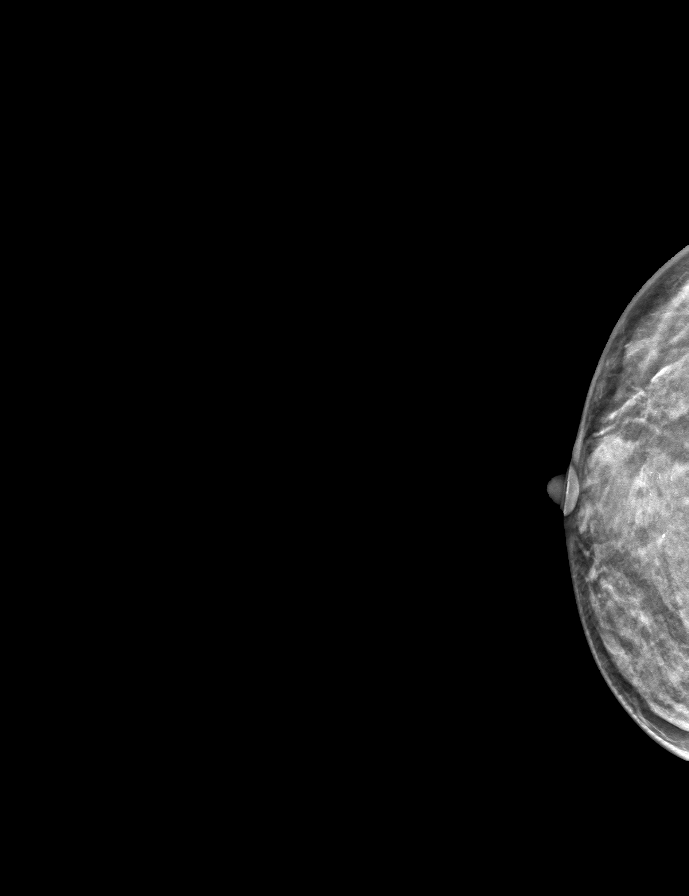
[frame 21/42]
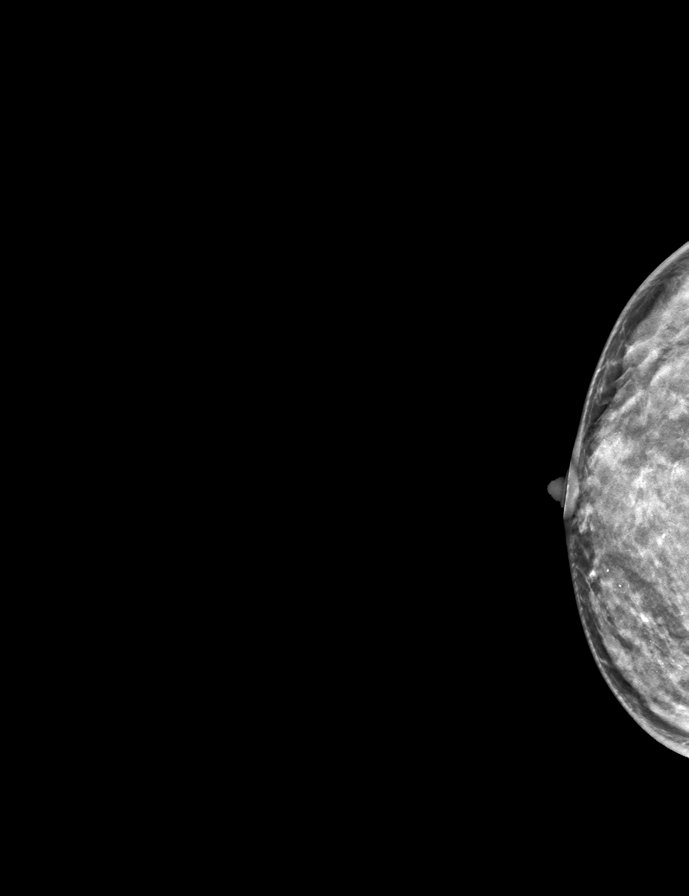

[R MLO tomo · tomo slice 21/41.0]
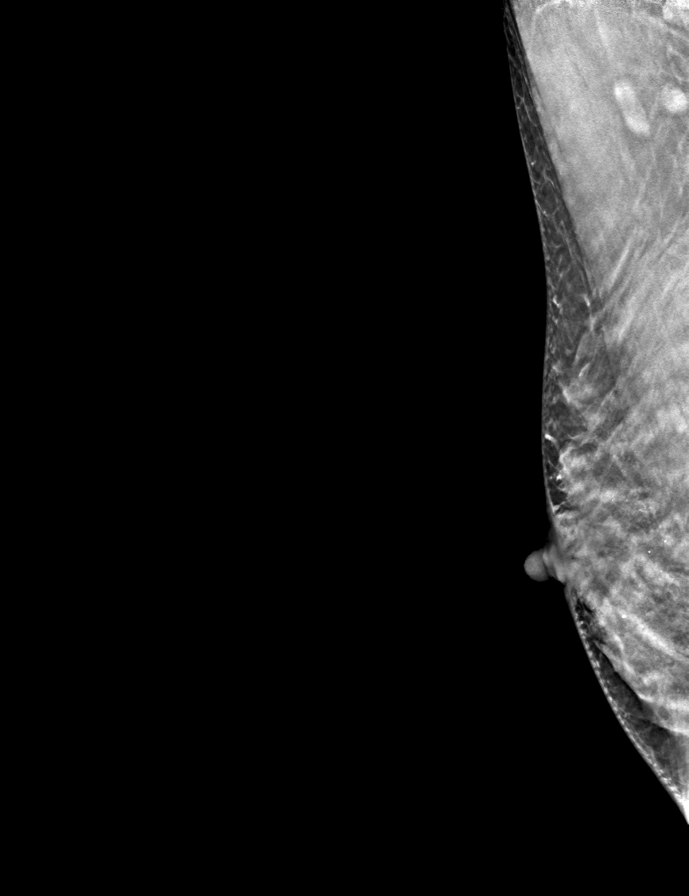

[L CC tomo · tomo slice 19/38.0]
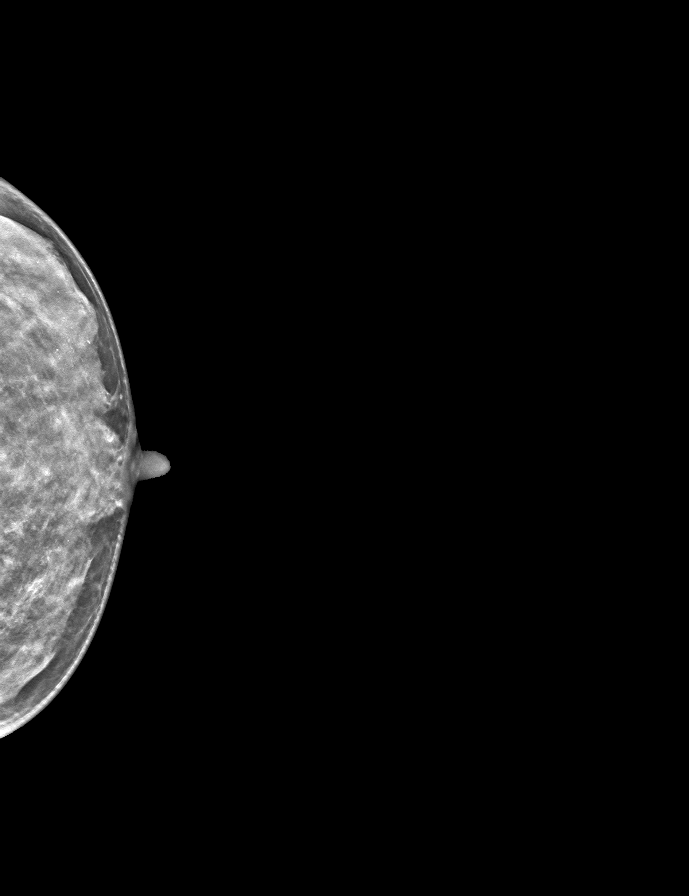

[L MLO tomo · tomo slice 19/38.0]
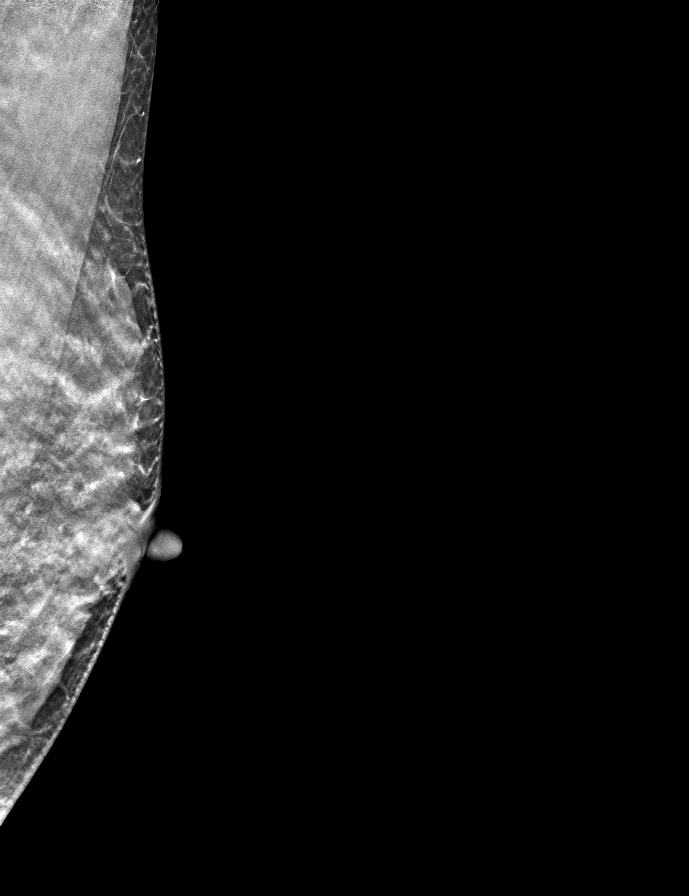

[9 of 24 positions shown; findings below may reference images not displayed]

ACR Breast Density Category c: The breast tissue is heterogeneously
dense, which may obscure small masses.
FINDINGS: Benign calcifications are seen bilaterally. No suspicious masses,
calcifications, or distortion.

Mammographic images were processed with CAD.

On physical exam, no suspicious lumps identified.

Targeted ultrasound is performed, showing no sonographic
abnormalities in the region pointed out by the patient. The region
indicated by the patient is actually inferior to the breast.
IMPRESSION: No mammographic or sonographic evidence of malignancy.

RECOMMENDATION:
Annual screening mammography.

I have discussed the findings and recommendations with the patient.
Results were also provided in writing at the conclusion of the
visit. If applicable, a reminder letter will be sent to the patient
regarding the next appointment.

BI-RADS CATEGORY  2: Benign.
